# Patient Record
Sex: Female | Born: 1995 | Race: White | Hispanic: No | Marital: Single | Smoking: Heavy tobacco smoker
Health system: Southern US, Community
[De-identification: ages and names within clinical notes are randomized; demographics above are authoritative.]

## PROBLEM LIST (undated history)

## (undated) DIAGNOSIS — B1920 Unspecified viral hepatitis C without hepatic coma: Secondary | ICD-10-CM

## (undated) DIAGNOSIS — F1990 Other psychoactive substance use, unspecified, uncomplicated: Secondary | ICD-10-CM

---

## 2015-11-24 ENCOUNTER — Encounter (HOSPITAL_COMMUNITY): Payer: Self-pay | Admitting: Emergency Medicine

## 2015-11-24 ENCOUNTER — Emergency Department (HOSPITAL_COMMUNITY)
Admission: EM | Admit: 2015-11-24 | Discharge: 2015-11-24 | Disposition: A | Discharge status: Home / Self Care | Payer: Managed Care, Other (non HMO) | Attending: Emergency Medicine | Admitting: Emergency Medicine

## 2015-11-24 DIAGNOSIS — F17210 Nicotine dependence, cigarettes, uncomplicated: Secondary | ICD-10-CM | POA: Diagnosis not present

## 2015-11-24 DIAGNOSIS — Z79899 Other long term (current) drug therapy: Secondary | ICD-10-CM | POA: Diagnosis not present

## 2015-11-24 DIAGNOSIS — F419 Anxiety disorder, unspecified: Secondary | ICD-10-CM

## 2015-11-24 DIAGNOSIS — F1320 Sedative, hypnotic or anxiolytic dependence, uncomplicated: Secondary | ICD-10-CM

## 2015-11-24 DIAGNOSIS — Z8619 Personal history of other infectious and parasitic diseases: Secondary | ICD-10-CM | POA: Diagnosis not present

## 2015-11-24 DIAGNOSIS — Z880 Allergy status to penicillin: Secondary | ICD-10-CM | POA: Diagnosis not present

## 2015-11-24 HISTORY — DX: Unspecified viral hepatitis C without hepatic coma: B19.20

## 2015-11-24 MED ORDER — CLONAZEPAM 0.5 MG PO TABS
1.0000 mg | ORAL_TABLET | Freq: Once | ORAL | Status: AC
  Administered 2015-11-24: 1 mg via ORAL
  Filled 2015-11-24: qty 2

## 2015-11-24 MED ORDER — CHLORDIAZEPOXIDE HCL 25 MG PO CAPS: ORAL_CAPSULE | ORAL | Status: AC

## 2015-11-24 NOTE — ED Notes (Signed)
Pt presents from home c/o sudden anxiety with racing heart and feeling like she is going to die.  No pain, no additional complaints or concerns.  Vitals WNL on arrival to room.

## 2015-11-24 NOTE — ED Provider Notes (Signed)
CSN: [id number]     Arrival date & time 11/24/15  1651 History   First MD Initiated Contact with Patient 11/24/15 1702     Chief Complaint  Patient presents with  . Panic Attack     (Consider location/radiation/quality/duration/timing/severity/associated sxs/prior Treatment) Patient is a 20 y.o. female presenting with mental health disorder. The history is provided by the patient.  Mental Health Problem Presenting symptoms: agitation   Presenting symptoms comment:  Anxiety  Patient accompanied by:  Family member Degree of incapacity (severity):  Mild Onset quality:  Gradual Duration:  2 days Timing:  Constant Progression:  Worsening Chronicity:  New Context: medication (out of klonipin at home)   Treatment compliance:  All of the time Time since last psychoactive medication taken:  2 days Relieved by:  Nothing Worsened by:  Nothing tried Ineffective treatments:  None tried Associated symptoms: anxiety   Associated symptoms: no chest pain and no headaches     20 yo F with a chief complaint anxiety. Patient states that she normally takes Klonopin 3 times a day for this. Patient has traveled here from [place]. She has no way to get home. She is very anxious that she is unable to get back to her home benzodiazepines. Patient states that she feels really nervous and anxious feels like her prior panic attacks. Denies any chest pain shortness of breath. Denies any nausea or vomiting.  Past Medical History  Diagnosis Date  . Hepatitis C    History reviewed. No pertinent past surgical history. History reviewed. No pertinent family history. Social History  Substance Use Topics  . Smoking status: Heavy Tobacco Smoker -- 0.50 packs/day for 5 years    Types: Cigarettes  . Smokeless tobacco: Never Used  . Alcohol Use: No   OB History    No data available     Review of Systems  Constitutional: Negative for fever and chills.  HENT: Negative for congestion and rhinorrhea.   Eyes:  Negative for redness and visual disturbance.  Respiratory: Negative for shortness of breath and wheezing.   Cardiovascular: Negative for chest pain and palpitations.  Gastrointestinal: Negative for nausea and vomiting.  Genitourinary: Negative for dysuria and urgency.  Musculoskeletal: Negative for myalgias and arthralgias.  Skin: Negative for pallor and wound.  Neurological: Negative for dizziness and headaches.  Psychiatric/Behavioral: Positive for agitation. The patient is nervous/anxious.       Allergies  Penicillins  Home Medications   Prior to Admission medications   Medication Sig Start Date End Date Taking? Authorizing Provider  busPIRone (BUSPAR) 7.5 MG tablet Take 7.5 mg by mouth 2 (two) times daily. 09/01/15  Yes Historical Provider, MD  chlordiazePOXIDE (LIBRIUM) 25 MG capsule  PO TID x 1D, then 25-50mg  PO BID X 1D, then 25-50mg  PO QD X 1D 11/24/15   Melene Plan, DO  clonazePAM (KLONOPIN) 2 MG tablet Take 2 mg by mouth 2 (two) times daily as needed for anxiety.   Yes Historical Provider, MD  hydrOXYzine (VISTARIL) 50 MG capsule TAKE ONE CAPSULE BY MOUTH TWICE A DAY AS NEEDED FOR ANXIETY 10/12/15  Yes Historical Provider, MD  QUEtiapine (SEROQUEL) 100 MG tablet Take 100 mg by mouth at bedtime as needed (sleep).  09/18/15  Yes Historical Provider, MD   BP 127/68 mmHg  Pulse 100  Temp(Src) 97.8 F (36.6 C) (Oral)  Resp 18  Ht  (1.6 m)  Wt 105 lb (47.628 kg)  BMI 18.60 kg/m2  SpO2 100%  LMP 10/27/2015 (Approximate) Physical Exam  Constitutional: She is oriented to person, place, and time. She appears well-developed and well-nourished. No distress.  HENT:  Head: Normocephalic and atraumatic.  Eyes: EOM are normal. Pupils are equal, round, and reactive to light.  Neck: Normal range of motion. Neck supple.  Cardiovascular: Normal rate and regular rhythm.  Exam reveals no gallop and no friction rub.   No murmur heard. Pulmonary/Chest: Effort normal. She has no  wheezes. She has no rales.  Abdominal: Soft. She exhibits no distension. There is no tenderness.  Musculoskeletal: She exhibits no edema or tenderness.  Neurological: She is alert and oriented to person, place, and time.  Skin: Skin is warm and dry. She is not diaphoretic.  Psychiatric: Her behavior is normal. Her mood appears anxious.  Nursing note and vitals reviewed.   ED Course  Procedures (including critical care time) Labs Review Labs Reviewed - No data to display  Imaging Review No results found. I have personally reviewed and evaluated these images and lab results as part of my medical decision-making.   EKG Interpretation   Date/Time:  Tuesday November 24 2015 17:38:02 EST Ventricular Rate:  99 PR Interval:  169 QRS Duration: 104 QT Interval:  333 QTC Calculation: 427 R Axis:   67 Text Interpretation:  Sinus rhythm RSR' in V1 or V2, right VCD or RVH No  old tracing to compare Confirmed by Nakhia Levitan MD, DANIEL [phone]) on 11/24/2015  5:41:34 PM      MDM   Final diagnoses:  Benzodiazepine dependence (HCC)  Anxiety    20 yo F with a chief complaints of anxiety. Patient has not been having her home medications for the past couple days. States that she left them at home. Unable to get a ride back.  This caused her anxiety causing her symptoms. Well-appearing and nontoxic. We'll get a screening EKG.  Screening EKG just with tachycardia. Will discharge patient home. Started her on Librium taper if needed.  5:43 PM:  I have discussed the diagnosis/risks/treatment options with the patient and family and believe the pt to be eligible for discharge home to follow-up with PCP. We also discussed returning to the ED immediately if new or worsening sx occur. We discussed the sx which are most concerning (e.g., sudden worsening sob, chest pain) that necessitate immediate return. Medications administered to the patient during their visit and any new prescriptions provided to the patient  are listed below.  Medications given during this visit Medications  clonazePAM (KLONOPIN) tablet 1 mg (1 mg Oral Given 11/24/15 1733)    New Prescriptions   CHLORDIAZEPOXIDE (LIBRIUM) 25 MG CAPSULE     PO TID x 1D, then 25-50mg  PO BID X 1D, then 25-50mg  PO QD X 1D    The patient appears reasonably screen and/or stabilized for discharge and I doubt any other medical condition or other [facility] requiring further screening, evaluation, or treatment in the ED at this time prior to discharge.    Melene Plan, DO 11/24/15 1744

## 2015-11-24 NOTE — Discharge Instructions (Signed)

## 2018-10-25 ENCOUNTER — Encounter (HOSPITAL_COMMUNITY): Payer: Self-pay | Admitting: Emergency Medicine

## 2018-10-25 ENCOUNTER — Emergency Department (HOSPITAL_COMMUNITY)
Admission: EM | Admit: 2018-10-25 | Discharge: 2018-10-26 | Disposition: A | Discharge status: Home / Self Care | Payer: Managed Care, Other (non HMO) | Attending: Emergency Medicine | Admitting: Emergency Medicine

## 2018-10-25 ENCOUNTER — Other Ambulatory Visit: Payer: Self-pay

## 2018-10-25 ENCOUNTER — Emergency Department (HOSPITAL_COMMUNITY): Payer: Managed Care, Other (non HMO)

## 2018-10-25 DIAGNOSIS — Z79899 Other long term (current) drug therapy: Secondary | ICD-10-CM | POA: Insufficient documentation

## 2018-10-25 DIAGNOSIS — R0789 Other chest pain: Secondary | ICD-10-CM | POA: Diagnosis not present

## 2018-10-25 DIAGNOSIS — R0602 Shortness of breath: Secondary | ICD-10-CM | POA: Insufficient documentation

## 2018-10-25 DIAGNOSIS — F1990 Other psychoactive substance use, unspecified, uncomplicated: Secondary | ICD-10-CM

## 2018-10-25 DIAGNOSIS — F17210 Nicotine dependence, cigarettes, uncomplicated: Secondary | ICD-10-CM | POA: Insufficient documentation

## 2018-10-25 HISTORY — DX: Other psychoactive substance use, unspecified, uncomplicated: F19.90

## 2018-10-25 LAB — I-STAT BETA HCG BLOOD, ED (MC, WL, AP ONLY): I-stat hCG, quantitative: <5 m[IU]/mL (ref <5)

## 2018-10-25 LAB — BASIC METABOLIC PANEL
ANION GAP: 5 (ref 5–15)
BUN: 16 mg/dL (ref 6–20)
CALCIUM: 9 mg/dL (ref 8.9–10.3)
CO2: 27 mmol/L (ref 22–32)
Chloride: 100 mmol/L (ref 98–111)
Creatinine, Ser: 1 mg/dL (ref 0.44–1.00)
GFR calc Af Amer: >60 mL/min (ref >60)
GFR calc non Af Amer: >60 mL/min (ref >60)
Glucose, Bld: 88 mg/dL (ref 70–99)
Potassium: 3.2 mmol/L — ABNORMAL LOW (ref 3.5–5.1)
Sodium: 132 mmol/L — ABNORMAL LOW (ref 135–145)

## 2018-10-25 LAB — CBC
HEMATOCRIT: 35.5 % — AB (ref 36.0–46.0)
Hemoglobin: 12.4 g/dL (ref 12.0–15.0)
MCH: 30.4 pg (ref 26.0–34.0)
MCHC: 34.9 g/dL (ref 30.0–36.0)
MCV: 87 fL (ref 80.0–100.0)
PLATELETS: 152 10*3/uL (ref 150–400)
RBC: 4.08 MIL/uL (ref 3.87–5.11)
RDW: 12.3 % (ref 11.5–15.5)
WBC: 3.7 10*3/uL — ABNORMAL LOW (ref 4.0–10.5)
nRBC: 0 % (ref 0.0–0.2)

## 2018-10-25 LAB — I-STAT TROPONIN, ED: Troponin i, poc: 0 ng/mL (ref 0.00–0.08)

## 2018-10-25 MED ORDER — LORAZEPAM 2 MG/ML IJ SOLN
1.0000 mg | Freq: Once | INTRAMUSCULAR | Status: AC
  Administered 2018-10-25: 1 mg via INTRAVENOUS
  Filled 2018-10-25: qty 1

## 2018-10-25 MED ORDER — ASPIRIN 81 MG PO CHEW
324.0000 mg | CHEWABLE_TABLET | Freq: Once | ORAL | Status: AC
  Administered 2018-10-25: 324 mg via ORAL
  Filled 2018-10-25: qty 4

## 2018-10-25 MED ORDER — LORAZEPAM 2 MG/ML IJ SOLN
1.0000 mg | Freq: Once | INTRAMUSCULAR | Status: DC
  Filled 2018-10-25: qty 1

## 2018-10-25 MED ORDER — SODIUM CHLORIDE 0.9 % IV BOLUS
1000.0000 mL | Freq: Once | INTRAVENOUS | Status: AC
  Administered 2018-10-25: 1000 mL via INTRAVENOUS

## 2018-10-25 MED ORDER — LORAZEPAM 2 MG/ML IJ SOLN
1.0000 mg | Freq: Once | INTRAMUSCULAR | Status: AC
  Administered 2018-10-25: 1 mg via INTRAVENOUS

## 2018-10-25 NOTE — ED Notes (Signed)
Gave pt turkey sandwich. 

## 2018-10-25 NOTE — ED Notes (Signed)
Patient verbalizes understanding of discharge instructions. Opportunity for questioning and answers were provided. Armband removed by staff, pt discharged from ED ambulatory.   

## 2018-10-25 NOTE — ED Triage Notes (Signed)
Pt reports she has used meth for the last few days. Pt reports she has done nothing the past few days but use meth. Pt reports her chest and left upper abdomen started hurting today. Pt reports some nausea, no vomiting. Pt reports hx of opiate use and currently takes suboxone and xanax.

## 2018-10-25 NOTE — Discharge Instructions (Addendum)
You will need to stop using methamphetamines or other illicit drugs as they may have caused her symptoms tonight.  You were given information on your discharge paperwork for resources that you can follow-up with us to help you stop using illegal drugs.  You are also given information to follow-up with a behavioral health specialist in regards to your anxiety.  Please return to the emergency department for any new or worsening symptoms.

## 2018-10-25 NOTE — ED Provider Notes (Signed)
[facility] EMERGENCY DEPARTMENT Provider Note   CSN: [id number] Arrival date & time: 10/25/18  1805     History   Chief Complaint Chief Complaint  Patient presents with  . Chest Pain    HPI Chelsea Hurst is a 22 y.o. female.  HPI   Patient is a 22 year old female with a history of hepatitis C, substance use disorder, who presents the emergency department today for evaluation of chest pain that began earlier today.  Patient states she has been using meth for the last several days.  She has been shooting up in her arms.  States that this afternoon she developed some chest pain.  She also reports some mild shortness of breath.  She states she has not slept in several days and she is very concerned.  Patient continually asks "am I going to die ".  Denies any nausea or vomiting.  No URI symptoms or cough.  No known fevers at home.  States she last used several hours prior to arrival.  States she is currently on Suboxone and Xanax and took a Xanax prior to arrival without significant improvement of her symptoms.  Past Medical History:  Diagnosis Date  . Hepatitis C   . Substance use disorder     There are no active problems to display for this patient.   History reviewed. No pertinent surgical history.   OB History   No obstetric history on file.      Home Medications    Prior to Admission medications   Medication Sig Start Date End Date Taking? Authorizing Provider  busPIRone (BUSPAR) 7.5 MG tablet Take 7.5 mg by mouth 2 (two) times daily. 09/01/15   [provider]  chlordiazePOXIDE (LIBRIUM) 25 MG capsule 50mg  PO TID x 1D, then 25-50mg  PO BID X 1D, then 25-50mg  PO QD X 1D 11/24/15   Melene Plan, DO  clonazePAM (KLONOPIN) 2 MG tablet Take 2 mg by mouth 2 (two) times daily as needed for anxiety.    [provider]  hydrOXYzine (VISTARIL) 50 MG capsule TAKE ONE CAPSULE BY MOUTH TWICE A DAY AS NEEDED FOR ANXIETY 10/12/15   [provider]  QUEtiapine (SEROQUEL) 100 MG tablet Take 100 mg by mouth at bedtime as needed (sleep).  09/18/15   [provider]    Family History No family history on file.  Social History Social History   Tobacco Use  . Smoking status: Heavy Tobacco Smoker    Packs/day: 0.50    Years: 5.00    Pack years: 2.50    Types: Cigarettes  . Smokeless tobacco: Never Used  Substance Use Topics  . Alcohol use: No  . Drug use: Yes    Types: Methamphetamines     Allergies   Penicillins   Review of Systems Review of Systems  Constitutional: Negative for fever.  HENT: Negative for ear pain and sore throat.   Eyes: Negative for visual disturbance.  Respiratory: Positive for shortness of breath. Negative for cough.   Cardiovascular: Positive for chest pain.  Gastrointestinal: Negative for abdominal pain, constipation, diarrhea, nausea and vomiting.  Genitourinary: Negative for dysuria and hematuria.  Musculoskeletal: Negative for back pain.  Skin: Negative for color change and rash.  Neurological: Negative for headaches.  All other systems reviewed and are negative.    Physical Exam Updated Vital Signs BP 114/78 (BP Location: Right Arm)   Pulse 88   Temp 98.6 F (37 C) (Oral)   Resp 16   SpO2  100%   Physical Exam Vitals signs and nursing note reviewed.  Constitutional:      General: She is not in acute distress.    Appearance: She is well-developed.  HENT:     Head: Normocephalic and atraumatic.     Mouth/Throat:     Mouth: Mucous membranes are dry.     Pharynx: No oropharyngeal exudate or posterior oropharyngeal erythema.     Comments: Lips are cracked Eyes:     Conjunctiva/sclera: Conjunctivae normal.  Neck:     Musculoskeletal: Neck supple.  Cardiovascular:     Rate and Rhythm: Normal rate and regular rhythm.     Pulses: Normal pulses.     Heart sounds: Normal heart sounds. No murmur.  Pulmonary:     Effort: Pulmonary effort is normal. No  respiratory distress.     Breath sounds: Normal breath sounds. No decreased breath sounds, wheezing, rhonchi or rales.  Abdominal:     General: Bowel sounds are normal.     Palpations: Abdomen is soft.     Tenderness: There is no abdominal tenderness.  Musculoskeletal:     Right lower leg: She exhibits no tenderness. No edema.     Left lower leg: She exhibits no tenderness. No edema.  Skin:    General: Skin is warm and dry.  Neurological:     Mental Status: She is alert.     Comments: Answering questions appropriately.  Psychiatric:        Mood and Affect: Mood is anxious.     Comments: Patient anxious on arrival and is tearful asking if she is going to die.  Patient appears to be intoxicated and likely under the influence of stimulants such as methamphetamines.      ED Treatments / Results  Labs (all labs ordered are listed, but only abnormal results are displayed) Labs Reviewed  BASIC METABOLIC PANEL - Abnormal; Notable for the following components:      Result Value   Sodium 132 (*)    Potassium 3.2 (*)    All other components within normal limits  CBC - Abnormal; Notable for the following components:   WBC 3.7 (*)    HCT 35.5 (*)    All other components within normal limits  RAPID URINE DRUG SCREEN, HOSP PERFORMED  I-STAT TROPONIN, ED  I-STAT BETA HCG BLOOD, ED ([facility], [facility], [facility] ONLY)    EKG EKG Interpretation  Date/Time:  Thursday October 25 2018 18:09:37 EST Ventricular Rate:  114 PR Interval:  124 QRS Duration: 96 QT Interval:  322 QTC Calculation: 443 R Axis:   84 Text Interpretation:  Sinus tachycardia Possible Left atrial enlargement RSR' or QR pattern in V1 suggests right ventricular conduction delay Borderline ECG When compared to prior, faster rate.  No STEMI Confirmed by Theda Belfastegeler, Chris ([id number]) on 10/25/2018 8:22:11 PM   Radiology Dg Chest Portable 1 View  Result Date: 10/25/2018 CLINICAL DATA:  Chest pain EXAM: PORTABLE CHEST 1 VIEW COMPARISON:  None.  FINDINGS: Lungs are clear. Heart size and pulmonary vascularity are normal. No adenopathy. There is lower thoracic dextroscoliosis. No pneumomediastinum or pneumothorax. IMPRESSION: No edema or consolidation. Electronically Signed   By: Bretta BangWilliam  Woodruff III M.D.   On: 10/25/2018 21:42    Procedures Procedures (including critical care time)  Medications Ordered in ED Medications  sodium chloride 0.9 % bolus 1,000 mL (0 mLs Intravenous Stopped 10/25/18 2135)  LORazepam (ATIVAN) injection 1 mg (1 mg Intravenous Given 10/25/18 2105)  aspirin chewable tablet 324 mg (324  mg Oral Given 10/25/18 2158)  LORazepam (ATIVAN) injection 1 mg (1 mg Intravenous Given 10/25/18 2247)     Initial Impression / Assessment and Plan / ED Course  I have reviewed the triage vital signs and the nursing notes.  Pertinent labs & imaging results that were available during my care of the patient were reviewed by me and considered in my medical decision making (see chart for details).  Final Clinical Impressions(s) / ED Diagnoses   Final diagnoses:  Atypical chest pain  Substance use disorder   Patient presenting to the emergency department today for evaluation of chest pain in the setting of methamphetamine use.  Began earlier today.  She is initially tachycardic on arrival however she is satting well on room air and has otherwise normal vitals.  Lungs and cardiac exam are within normal limits.  Patient is very anxious and does appear to be under the influence of stimulant.  She was given Ativan and fluids in the ED and her symptoms resolved.  Her labs are reassuring.  She has no white count, no anemia.  Electrolytes are normal.  Kidney function is normal.  Troponin is negative.  Beta hCG is negative.  EKG shows sinus tachycardia but no acute ischemic changes.  Chest x-ray does not show evidence of pneumonia, pneumothorax or other acute abnormality.  UDS for benzos, amphetamines and THC. Low suspicion for acute cardiac  or pulmonary etiology at this time that would require further work-up or admission to the hospital.  Suspect the patient's symptoms may be related to her methamphetamine use, have advised her to discontinue using methamphetamines.  Will give resources for substance abuse counseling and programs as an outpatient.  We will also give her psychiatric follow-up.  She does not appear to be acutely psychotic or in need of psychiatric evaluation at this time.  Does appear stable for discharge with close follow-up.  Strict return precautions were discussed.  Patient voices understanding the plan and reasons to return to the ED.  All questions answered.  ED Discharge Orders    None       Karrie MeresCouture, Mckoy Bhakta S, [place]PA-C 10/26/18 0036    Tegeler, Canary Brimhristopher J, MD 10/26/18 [phone]

## 2018-10-26 LAB — RAPID URINE DRUG SCREEN, HOSP PERFORMED
AMPHETAMINES: POSITIVE — AB
BENZODIAZEPINES: POSITIVE — AB
Barbiturates: NOT DETECTED
Cocaine: NOT DETECTED
Opiates: NOT DETECTED
TETRAHYDROCANNABINOL: POSITIVE — AB

## 2020-06-09 IMAGING — DX DG CHEST 1V PORT
1 series · 1 of 1 positions shown · non-contrast
Comparison: None.

CLINICAL DATA: Chest pain

EXAM:
PORTABLE CHEST 1 VIEW

[chest ap]
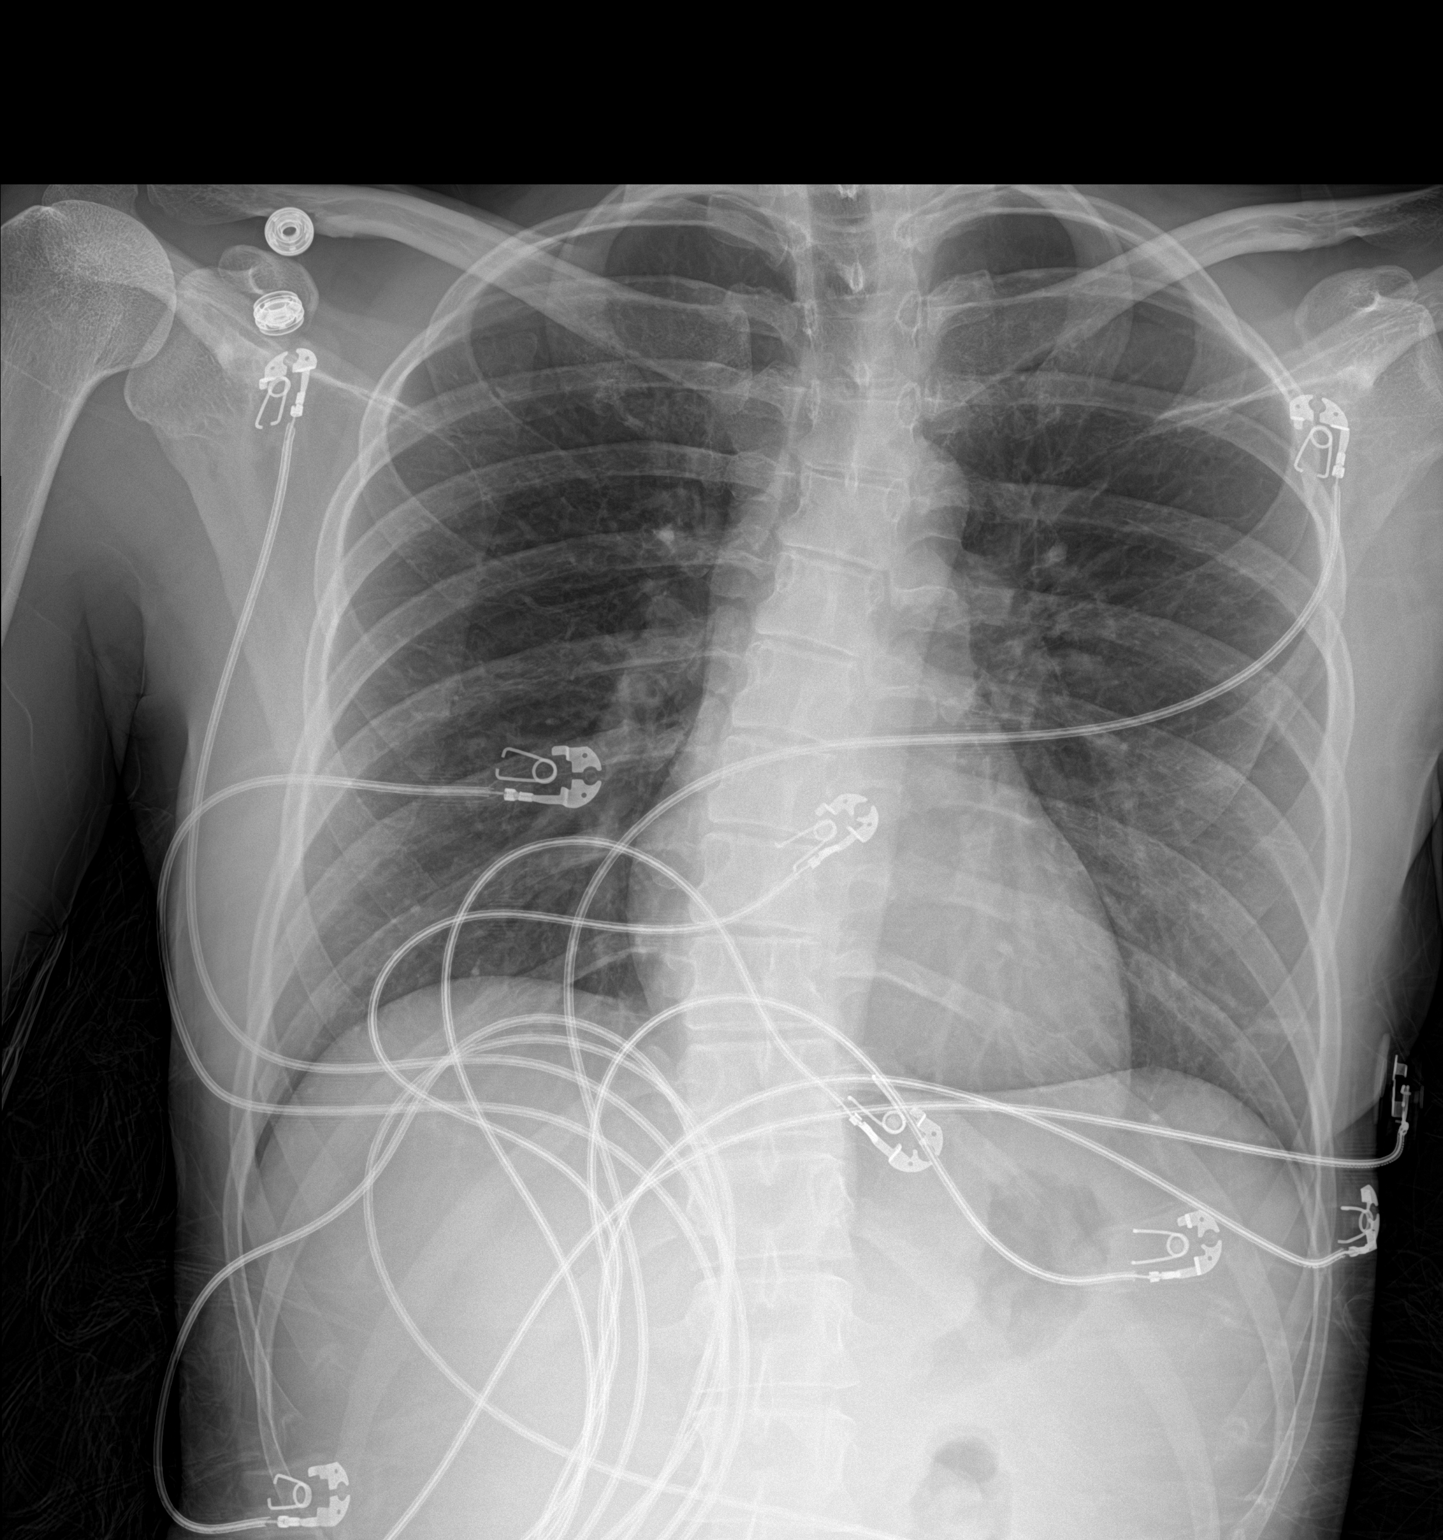

[1 of 1 positions shown; findings below may reference images not displayed]

FINDINGS: Lungs are clear. Heart size and pulmonary vascularity are normal. No
adenopathy. There is lower thoracic dextroscoliosis. No
pneumomediastinum or pneumothorax.
IMPRESSION: No edema or consolidation.
